# Patient Record
Sex: Male | Born: 1960 | Race: White | Hispanic: No | Marital: Married | Smoking: Never smoker
Health system: Southern US, Community
[De-identification: ages and names within clinical notes are randomized; demographics above are authoritative.]

---

## 2006-11-28 ENCOUNTER — Ambulatory Visit (HOSPITAL_COMMUNITY): Admission: RE | Admit: 2006-11-28 | Discharge: 2006-11-29 | Payer: Self-pay | Admitting: Neurological Surgery

## 2006-12-31 ENCOUNTER — Encounter: Admission: RE | Admit: 2006-12-31 | Discharge: 2006-12-31 | Payer: Self-pay | Admitting: Neurological Surgery

## 2007-03-19 ENCOUNTER — Encounter: Admission: RE | Admit: 2007-03-19 | Discharge: 2007-03-19 | Payer: Self-pay | Admitting: Neurological Surgery

## 2007-03-22 ENCOUNTER — Ambulatory Visit (HOSPITAL_COMMUNITY): Admission: RE | Admit: 2007-03-22 | Discharge: 2007-03-22 | Payer: Self-pay | Admitting: Orthopedic Surgery

## 2007-12-02 ENCOUNTER — Encounter: Admission: RE | Admit: 2007-12-02 | Discharge: 2007-12-02 | Payer: Self-pay | Admitting: Neurological Surgery

## 2007-12-17 IMAGING — CR DG CERVICAL SPINE 1V
2 series · 2 of 2 positions shown · non-contrast
Comparison: 12/31/06

CLINICAL DATA: Recent C5 - 7 anterior cervical diskectomy and fusion.
CERVICAL SPINE   - 2 VIEW:

[w c-spine lat]
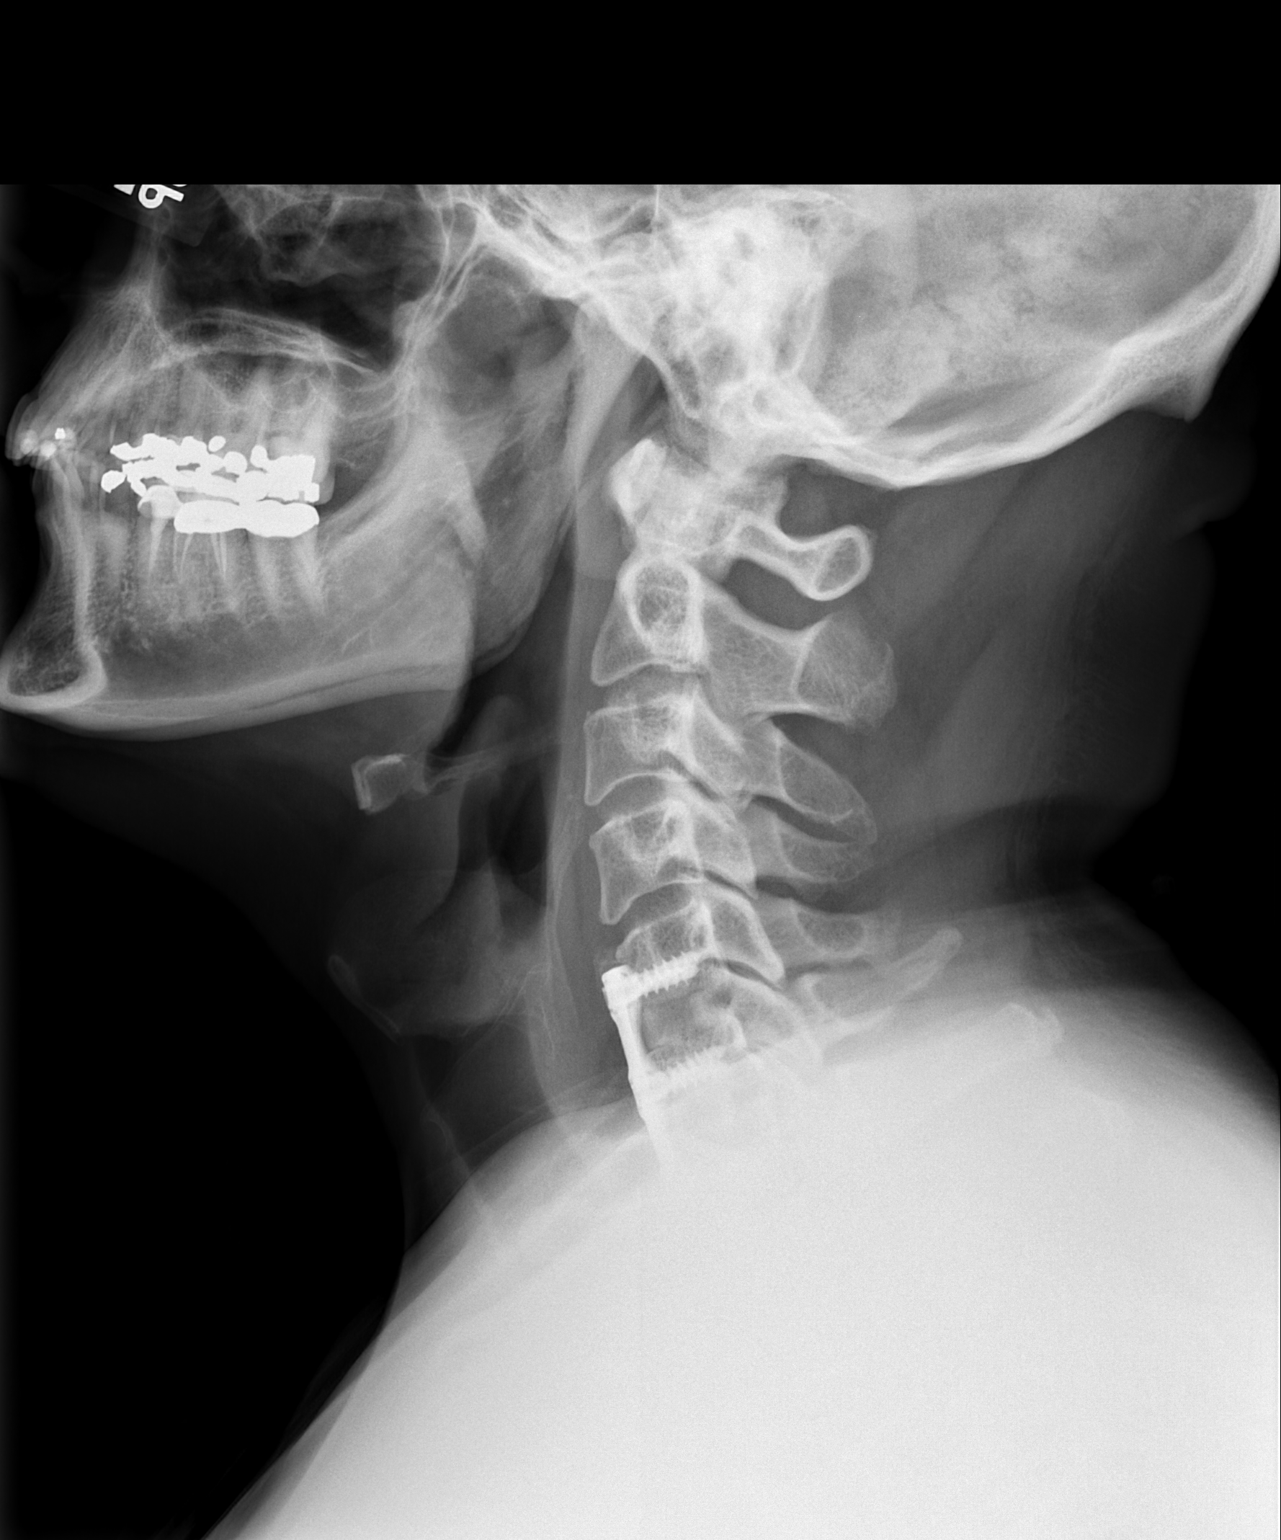

[w swimmers view *]
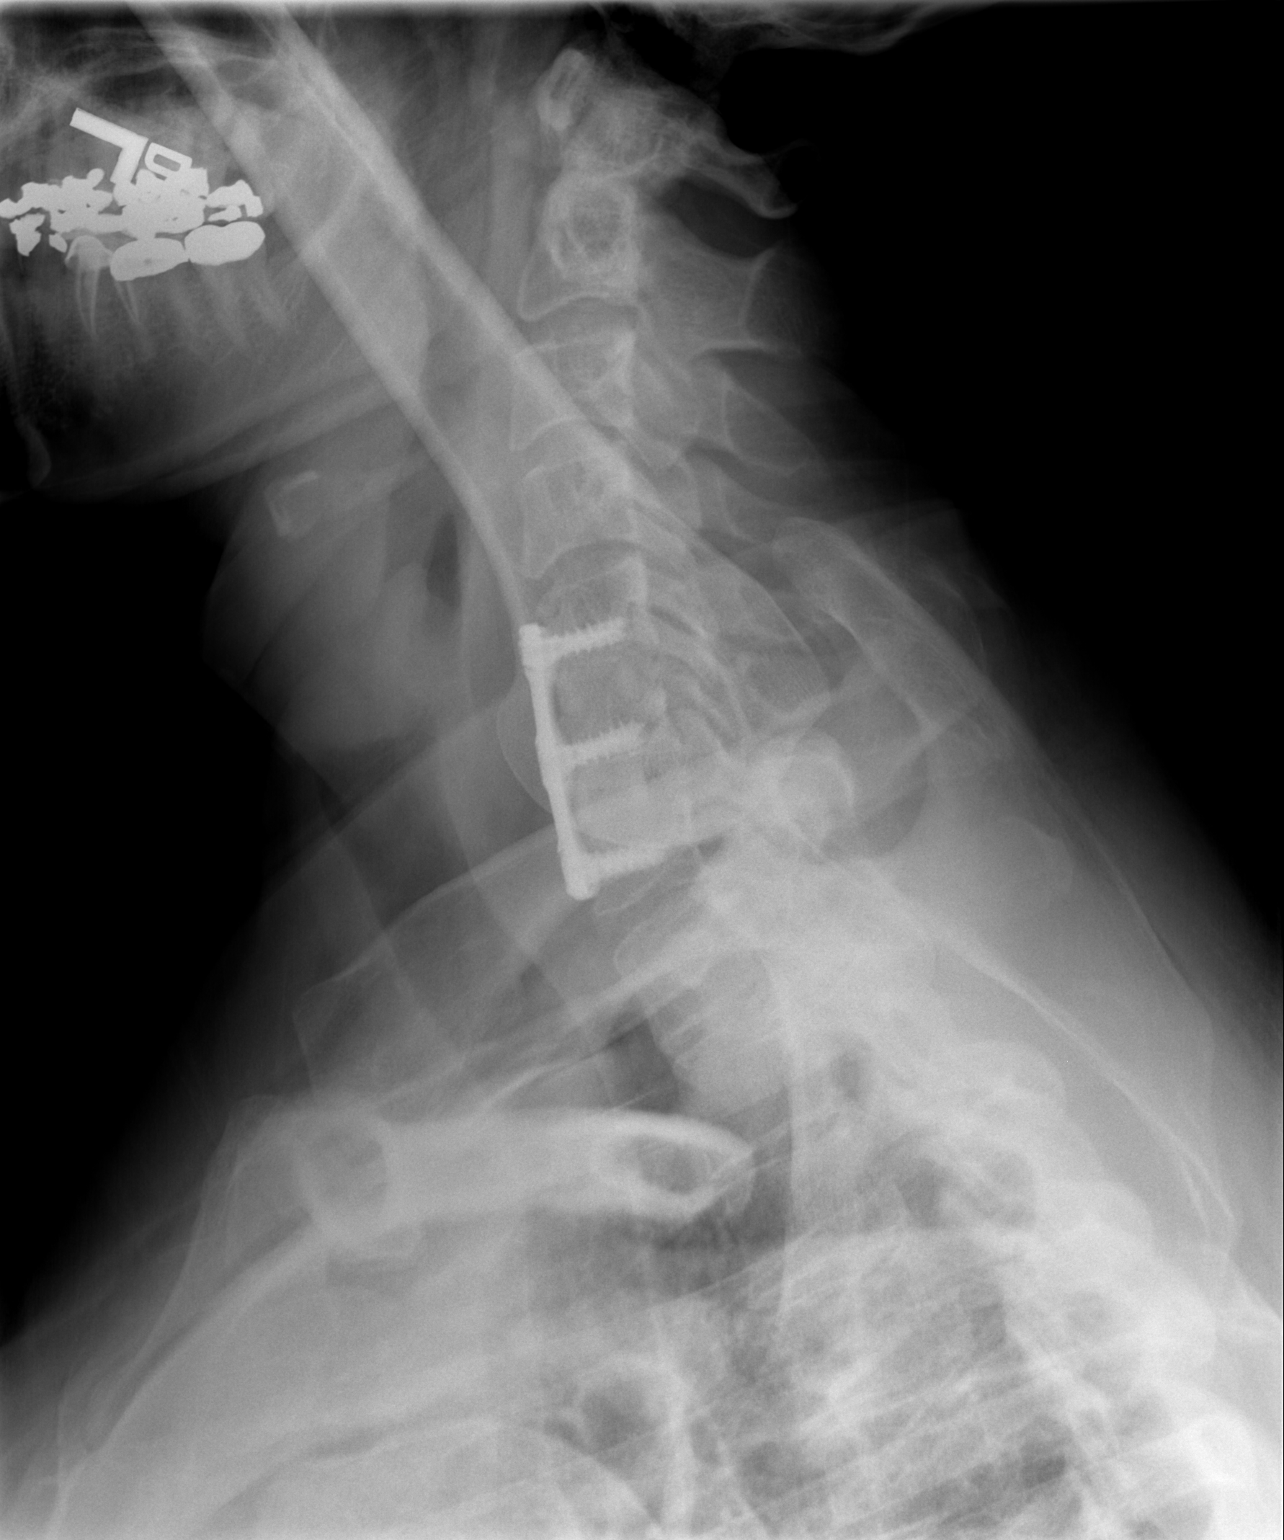

[2 of 2 positions shown; findings below may reference images not displayed]

FINDINGS: Cervical spine alignment is anatomic.  C5 - C7 hardware is stable.  Bony interface along the C5-6 and C6-7 bone grafts are less apparent consistent with evidence of some interval bony fusion.
IMPRESSION: Status post C5-6 and C6-7 anterior cervical diskectomy and fusion.  Stable exam.   No acute finding.

## 2008-08-31 IMAGING — CR DG CERVICAL SPINE 1V
1 series · 1 of 1 positions shown · non-contrast
Comparison: 03/19/2007.

CLINICAL DATA: Anterior cervical fusion.

CERVICAL SPINE - 1 VIEW

[view not recorded]
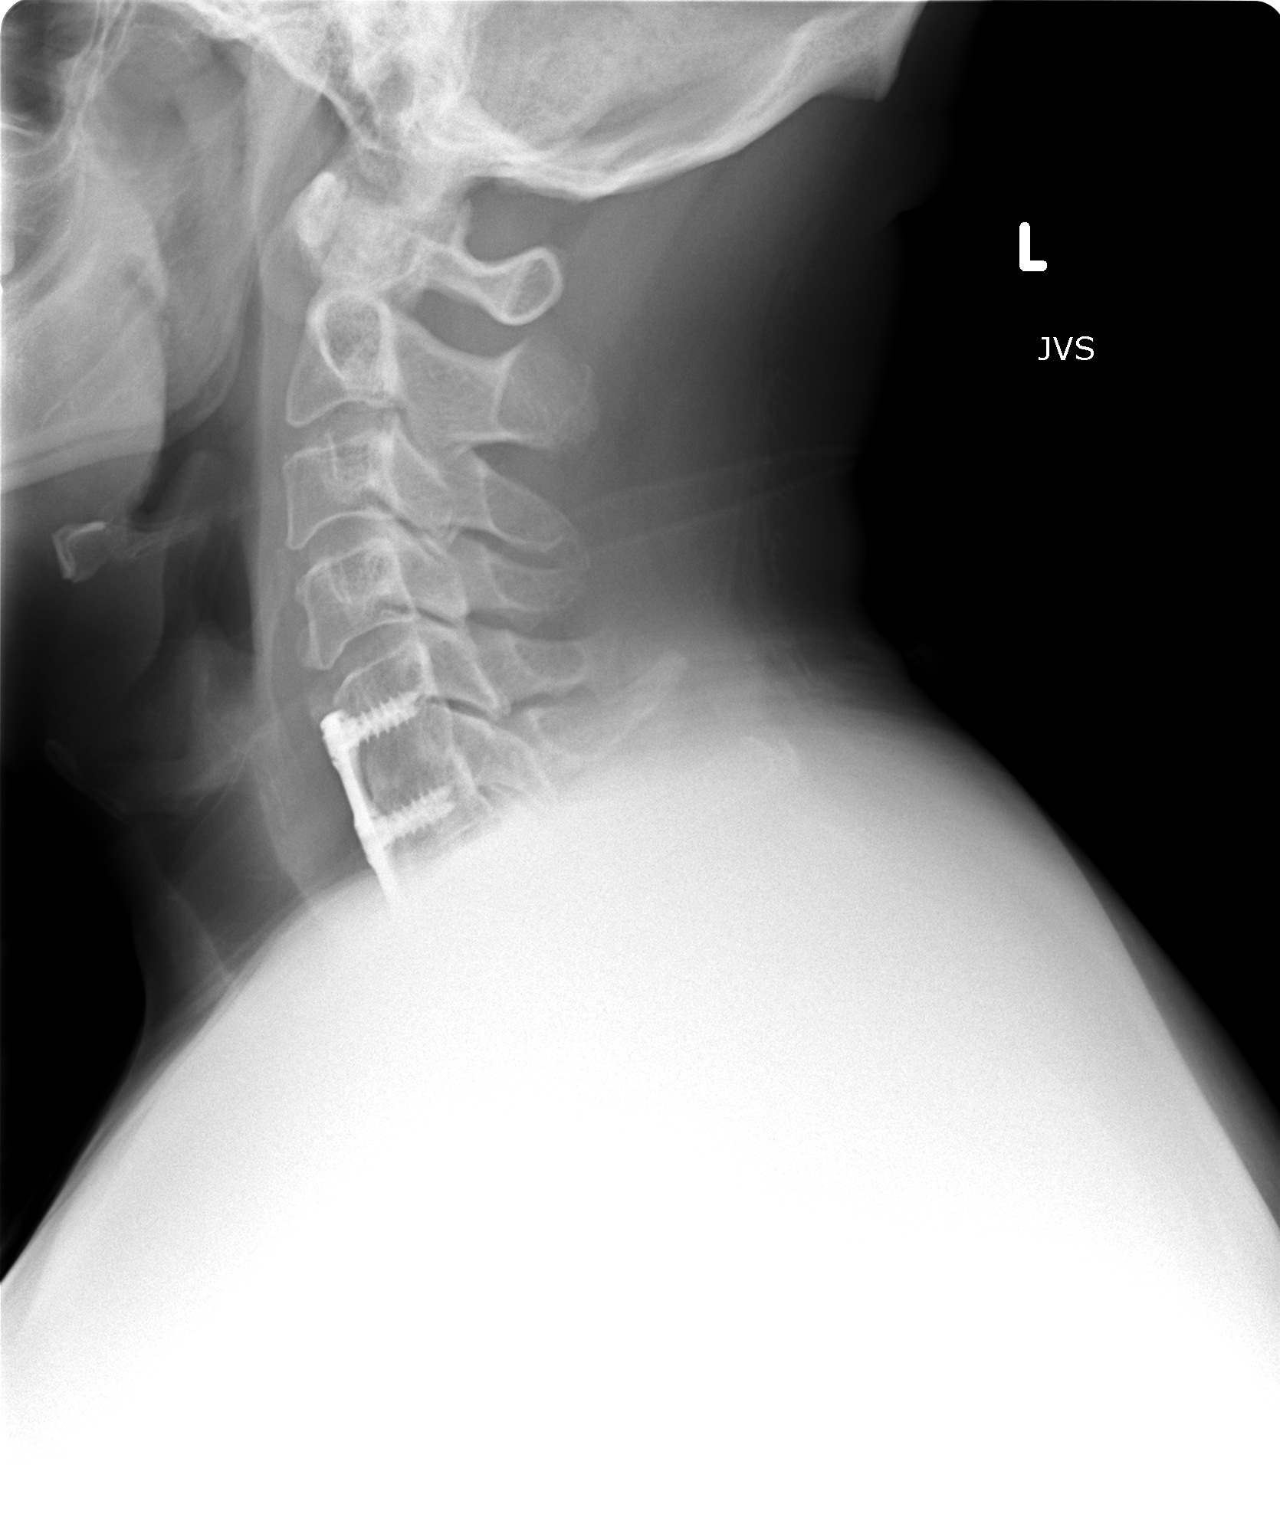

[1 of 1 positions shown; findings below may reference images not displayed]

FINDINGS: Cervical spine is visualized from the occiput to C6.  C7
and below are obscured by the patient's shoulders.  The patient is
status post C5-7 anterior cervical fusion.  Again, the lower
portion of the fusion is obscured by the patient's shoulders.
Prevertebral soft tissues are within normal limits. Alignment is
anatomic.
IMPRESSION: C5-7 anterior cervical fusion is partially obscured.  No acute
findings.

## 2010-11-01 NOTE — Op Note (Signed)
NAMEDEMICO, PLOCH            ACCOUNT NO.:  [id number]   MEDICAL RECORD NO.:  [id number]          PATIENT TYPE:  AMB   LOCATION:  SDS                          FACILITY:  [facility]   PHYSICIAN:  Tia Alert, MD     DATE OF BIRTH:  [birth year]   DATE OF PROCEDURE:  03/22/2007  DATE OF DISCHARGE:                               OPERATIVE REPORT   PREOPERATIVE DIAGNOSIS:  Right carpal tunnel syndrome.   POSTOPERATIVE DIAGNOSIS:  Right carpal tunnel syndrome.   PROCEDURE:  Right carpal tunnel release.   SURGEON:  Tia Alert, M.D.   ANESTHESIA:  Local MAC.   COMPLICATIONS:  None apparent.   INDICATIONS FOR PROCEDURE:  Mr. Sloop is a 50 year old gentleman who  underwent a left carpal tunnel release several months ago.  He presented  with right hand symptoms.  He had EMGs which suggested right carpal  tunnel syndrome.  I recommended right carpal tunnel release.  He  understood the risks, benefits, expected outcome, and wished to proceed.   DESCRIPTION OF PROCEDURE:  The patient was taken to the operating room  and placed in the supine position on the operating room table.  His  right hand was prepped circumferentially down to the elbow with DuraPrep  and draped in the usual sterile fashion.  8 mL of local anesthesia was  injected and a small palmar incision was made starting at the distal  wrist crease into the palm in line with the web space between the third  and fourth digits.  I dissected down through the palmar fascia.  A self-  retaining retractor was placed and the transverse carpal ligament was  identified and opened with a 15 blade scalpel and the underlying median  nerve was identified.  I then spread between the median nerve and the  transverse carpal ligament with a curved mosquito and dissected the  transverse carpal ligament distally into the palm until the palmar fat  was noticed and the transverse carpal ligament was completely transected  distally into the  palm.  I then palpated with the mosquito to assure the  transverse carpal ligament was completely transected.  I then performed  the same maneuver more proximally under the wrist crease utilizing a  mosquito to spread between the ligament and the nerve.  I transected the  ligament more proximally into the arm until it was completely  transected. I then palpated, once again, to assure adequate  decompression.  I then irrigated with saline solution containing  bacitracin, inspected the nerve, once again, and then closed the palmar  fascia and subcutaneous tissues with 3-0 Vicryl and closed the skin with  interrupted 4-0 Ethilon vertical mattress  sutures.  A sterile dressing was then applied along with Kerlix and Ace  bandage.  The patient was then taken to the recovery room in stable  condition.  At the end of the procedure, all sponge, needle and  instruments counts were correct.      Tia Alert, MD  Electronically Signed     DSJ/MEDQ  D:  03/22/2007  T:  03/23/2007  Job:  [id number]

## 2010-11-01 NOTE — Op Note (Signed)
[name], [name]            ACCOUNT NO.:  [id number]   MEDICAL RECORD NO.:  [id number]          PATIENT TYPE:  AMB   LOCATION:  SDS                          FACILITY:  [facility]   PHYSICIAN:  Tia Alert, MD     DATE OF BIRTH:  1960/12/25   DATE OF PROCEDURE:  11/28/2006  DATE OF DISCHARGE:                               OPERATIVE REPORT   PREOPERATIVE DIAGNOSIS:  Cervical spondylosis C5-6, C6-7 with neck and  left arm pain.   POSTOPERATIVE DIAGNOSIS:  Cervical spondylosis C5-6, C6-7 with neck and  left arm pain.   PROCEDURES:  1. Decompressive anterior cervical diskectomy C5-6, C6-7.  2. Anterior cervical arthrodesis C5-6, C6-7 utilizing 7-mm      corticocancellous allograft.  3. Anterior cervical plating C5-C7 inclusive utilizing a 42 mm Accufix      plate.   SURGEON:  Dr. Marikay Alar   ASSISTANT:  Dr. Altamease Oiler.   ANESTHESIA:  General endotracheal.   COMPLICATIONS:  None apparent.   INDICATIONS FOR PROCEDURE:  Mr. Arredondo is a 50 year old gentleman who  is referred with severe left sided neck pain which radiated to his left  shoulder and down the left arm.  He had numbness and tingling in his  left hand mostly in the thumb, index and middle fingers.  He had tried  medical management for some time without significant relief.  Had an MRI  which showed significant spondylosis at C5-6 and C6-7 with neural  foraminal narrowing at both levels with compression of left C6-C7 nerve  roots.  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  He  understood the risks and expected outcome and wished to proceed.   DESCRIPTION OF  PROCEDURE:  The patient was taken to operating room and  after induction of adequate generalized endotracheal anesthesia he was  placed in supine position on the operating room table.  His carpal  tunnel surgery was done first and that be will dictated in separate  operative note.  That was then broken down and his right anterior  cervical region was prepped with DuraPrep and draped in usual sterile  fashion.  5 mL of local anesthesia injected and a small transverse  incision was made to the right of midline and carried down to the  platysma which was elevated, opened and undermined with Metzenbaum  scissors.  I then dissected in a plane medial to the sternocleidomastoid  muscle and internal carotid artery and lateral to the trachea and  esophagus to expose C5-6, C6-7.  Intraoperative fluoroscopy confirmed my  level and then the longus colli muscles were taken down at C5-6 and C6-7  and shadow line retractors placed under this.  It was very difficult to  identify the level.  We placed the  needle up to the C4-5 interspace and  counted down from there to actually obtain our levels at C5-6 and C6-7.  Once we were confident of our levels, we incised the disk space at C5-6,  C6-7 and performed initial diskectomy with pituitary rongeurs and curved  curettes and we used the high-speed drill to drill the endplates to  prepare for later arthrodesis.  We drilled the disk spaces to a height  of 7 mm and drilled in a rectangular fashion preparing the endplates for  later arthrodesis.  We drilled down to the level of the posterior  osteophytes and posterior longitudinal ligament.  We then brought in  operating microscope started C5-6 level.  We opened the posterior  longitudinal ligament with a nerve hook and then undercut the bodies of  C5-C6 as best we could with a 2-mm Kerrison punch to decompress the  central canal until we could see the cord pulsatile through the dura.  We also of performed  bilateral foraminotomies paying particular  attention to the left side because of his left sided radiculopathy.  The  left C6 nerve was identified and decompressed out distally into the  foramen.  We then used a black nerve hook to palpate along the superior  part of the pedicle followed out into the foramen, following along the  nerve root to assure adequate decompression of nerve root of the central  canal.  We then irrigated saline solution, lined the dura with Gelfoam  and then proceeded to decompress the C6-7 level in the same fashion  opening the posterior longitudinal ligament with a black nerve hook and  removing it while circumferentially undercutting the bodies of C6 and  C7. Bilateral foraminotomies were performed until the C7 nerve roots  were identified.  We palpated along the pedicle, marched along the  pedicle, identified the nerve root and followed it out into the foramen  until it was adequately decompressed.  We then palpated with a nerve  hook into the midline and the foramen to assure adequate decompression.  Once again we could see the cord pulsatile through the dura and the dura  was full and capacious at both levels all the way across.  We felt like  we had a good decompression of the central canal and of the C6-C7 nerve  roots, especially on the left side because of his left side  radiculopathy.  We then irrigated with saline solution containing  bacitracin, measured the interspaces and placed two 7-mm  corticocancellous allografts at C5-6 and C6-7.  We then used a 42 mm  Accufix plate placed two 14 mm variable angle screws in the bodies of  C5, C6 and C7 and these locked into the plate by locking mechanism  within the plate.  We then irrigated with saline solution, dried all  bleeding points with bipolar cautery and once meticulous hemostasis was  achieved closed the platysma with 3-0 Vicryl, closed the subcuticular tissue with 3-0 Vicryl, closed skin with Benzoin  Steri-Strips.  The  drapes removed.  Sterile dressing was applied.  The patient was awakened  from anesthesia, transferred to the recovery room in stable condition.  At the end of the procedure all sponge, needle and sponge counts were  correct.      Tia Alert, MD  Electronically Signed     DSJ/MEDQ  D:  11/28/2006  T:  11/29/2006  Job:  [id number]

## 2010-11-01 NOTE — Op Note (Signed)
[name], [name]            ACCOUNT NO.:  [id number]   MEDICAL RECORD NO.:  [id number]          PATIENT TYPE:  AMB   LOCATION:  SDS                          FACILITY:  [facility]   PHYSICIAN:  Tia Alert, MD     DATE OF BIRTH:  Oct 31, 1960   DATE OF PROCEDURE:  11/28/2006  DATE OF DISCHARGE:                               OPERATIVE REPORT   PREOPERATIVE DIAGNOSIS:  Left carpal tunnel syndrome.   POSTOPERATIVE DIAGNOSIS:  Left carpal tunnel syndrome.   OPERATION PERFORMED:  Left carpal tunnel release.   SURGEON:  Tia Alert, MD   ANESTHESIA:  General endotracheal.   COMPLICATIONS:  None apparent.   INDICATIONS FOR PROCEDURE:  [name] is a 50 year old gentleman who  was referred with significant left neck and arm pain.  He also had  numbness and tingling in his left hand, especially at night and when  driving.  He denied any weakness in the hand, but complained of numbness  in a median nerve distribution.  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  I recommended a left carpal tunnel  release.  He also had cervical spondylosis with significant neck pain,  interscapular pain, left shoulder pain and left arm pain in a radicular  distribution.  I recommended at the same sitting in a staged fashion, an  anterior cervical diskectomy, fusion plating at C5-6 and C6-7.  He  understood the risks and benefits and expected outcome and wished to  proceed.   DESCRIPTION OF PROCEDURE:  The patient was taken to the operating room  and after induction of adequate general endotracheal anesthesia, he was  placed in the supine position with his left extended on an arm board.  His hand and arm were prepped circumferentially with Duraprep and draped  in the usual sterile fashion.  9 mL of local anesthesia was injected and  a small palmar incision was made from the distal wrist crease into  the  palm in line with the middle finger.  He had an old scar in the  hypothenar eminence of the left hand.  We wanted to avoid that scar to  assure adequate healing of this wound.  Therefore, we went 2 mm more  medial than we usually incise.  I took the incision down to the palmar  fat, which was then coagulated and I dissected through the palmar fascia  with a 15 blade scalpel until the transverse ligament was identified.  I  dissected through the transverse carpal ligament until the underlying  medial nerve was identified.  I then spread between the nerve and the  ligament with a hemostat and transected the ligament proximally into the  arm under the wrist crease until the transverse carpal ligament was  completely transected and the nerve was free.  I inspected the nerve and  the nerve looked healthy.  I then dissected between the nerve and the  ligament more distally into the palm and incised the transverse carpal  ligament until it was completely transected distally into the  palm and  the palmar fat was identified.  I then palpated with a hemostat under  the transverse carpal ligament over the nerve root, but distally and  proximally to assure adequate decompression of the nerve.  The nerve  looked healthy and free.  I then irrigated with saline solution,  inspected the nerve once again, palpated with the hemostat once again to  assure adequate decompression of the nerve and complete transection of  the transverse carpal ligament.  I then dried all bleeding points and  then closed the palmar fascia with a single 3-0 Vicryl, closed the  subcuticular tissues with interrupted 3-0 Vicryls and closed the skin  with  interrupted 4-0 Ethilon vertical mattress sutures.  The hand was then  wrapped in a Kerlix and an Ace bandage and the patient was prepared for  anterior cervical diskectomy and fusion with plating.  At the end of  this procedure, all sponge, needle and instrument counts were  correct.      Tia Alert, MD  Electronically Signed     DSJ/MEDQ  D:  11/28/2006  T:  11/29/2006  Job:  [id number]

## 2011-03-30 LAB — BASIC METABOLIC PANEL
Chloride: 103
GFR calc non Af Amer: >60
Glucose, Bld: 89
Potassium: 4
Sodium: 137

## 2011-03-30 LAB — CBC
HCT: 44.8
Hemoglobin: 15.6
WBC: 5.9

## 2011-03-30 LAB — DIFFERENTIAL
Eosinophils Relative: 2
Lymphocytes Relative: 44
Lymphs Abs: 2.6
Monocytes Absolute: 0.5

## 2011-04-06 LAB — DIFFERENTIAL
Basophils Relative: 1
Eosinophils Absolute: 0.1
Monocytes Absolute: 0.8 — ABNORMAL HIGH
Monocytes Relative: 10
Neutro Abs: 3.3

## 2011-04-06 LAB — CBC
Hemoglobin: 15.8
MCHC: 34.4
MCV: 87.3
RBC: 5.25

## 2011-04-06 LAB — BASIC METABOLIC PANEL
CO2: 24
Chloride: 99
GFR calc Af Amer: >60
Potassium: 4
Sodium: 134 — ABNORMAL LOW

## 2011-04-06 LAB — APTT: aPTT: 33

## 2012-03-06 ENCOUNTER — Other Ambulatory Visit: Payer: Self-pay | Admitting: Orthopedic Surgery

## 2012-03-06 DIAGNOSIS — M25559 Pain in unspecified hip: Secondary | ICD-10-CM

## 2012-03-07 ENCOUNTER — Ambulatory Visit
Admission: RE | Admit: 2012-03-07 | Discharge: 2012-03-07 | Disposition: A | Discharge status: Home / Self Care | Payer: 59 | Source: Ambulatory Visit | Attending: Orthopedic Surgery | Admitting: Orthopedic Surgery

## 2012-03-07 DIAGNOSIS — M25559 Pain in unspecified hip: Secondary | ICD-10-CM

## 2013-06-27 ENCOUNTER — Ambulatory Visit: Payer: 59 | Admitting: Podiatrist

## 2013-07-10 ENCOUNTER — Other Ambulatory Visit: Payer: Self-pay | Admitting: Podiatrist

## 2013-07-10 ENCOUNTER — Ambulatory Visit (INDEPENDENT_AMBULATORY_CARE_PROVIDER_SITE_OTHER): Payer: 59 | Admitting: Podiatrist

## 2013-07-10 ENCOUNTER — Encounter: Payer: Self-pay | Admitting: Podiatrist

## 2013-07-10 VITALS — BP 142/84 | HR 79 | Resp 12 | Ht 72.0 in | Wt 265.0 lb

## 2013-07-10 DIAGNOSIS — M216X9 Other acquired deformities of unspecified foot: Secondary | ICD-10-CM

## 2013-07-10 DIAGNOSIS — Q828 Other specified congenital malformations of skin: Secondary | ICD-10-CM

## 2013-07-10 NOTE — Progress Notes (Signed)
   Subjective:    Patient ID: [name], male    DOB: 07/10/1960, 53 y.o.   MRN: [id number]  HPI Comments: '' Both feet have warts and been hurting for 1 years. Tried to freeze them and is not helping.''  Patient presents today with what he believes to be warts on bilateral feet at the fifth metatarsal area. He states he's been hurting him for one year and he's tried freezing them off the year ago. He states he has to work on hard concrete floors that they are painful and symptomatic with pressure and in shoes. Also works as a Environmental health practitionerduck hunting guide and relates he has problems standing on his feet for long periods of time. Review of Systems  All other systems reviewed and are negative.       Objective:   Physical Exam  vascular status is intact with palpable pedal pulses and neurological sensation intact. Prominent plantarflexed fifth metatarsal is present bilateral. He has overlying hyperkeratotic lesions x4 on the right foot which appear to be small intractable plantar keratotic lesions. They're painful with direct pressure and with palpation. The left foot has 1 lesion on the plantar lateral aspect of the fifth metatarsal head. It is also painful and symptomatic. Digital nails are asymptomatic.      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  Plan: Excise the lesions with a #15 blade without complication. Applied salinocaine and a aperture pad. Discussed the positive long-term benefits of orthotics with a cut out for the fifth metatarsal head with a soft spongy top cover. He was scanned at today's visit. I will see him when he orthotics are ready to be picked up.
